# Patient Record
Sex: Male | Born: 1935 | Race: White | Hispanic: No | Smoking: Current every day smoker
Health system: Southern US, Community
[De-identification: ages and names within clinical notes are randomized; demographics above are authoritative.]

## PROBLEM LIST (undated history)

## (undated) DIAGNOSIS — I10 Essential (primary) hypertension: Secondary | ICD-10-CM

## (undated) DIAGNOSIS — K859 Acute pancreatitis without necrosis or infection, unspecified: Secondary | ICD-10-CM

## (undated) DIAGNOSIS — N183 Chronic kidney disease, stage 3 unspecified: Secondary | ICD-10-CM

## (undated) DIAGNOSIS — C44300 Unspecified malignant neoplasm of skin of unspecified part of face: Secondary | ICD-10-CM

## (undated) DIAGNOSIS — I350 Nonrheumatic aortic (valve) stenosis: Secondary | ICD-10-CM

## (undated) DIAGNOSIS — M1990 Unspecified osteoarthritis, unspecified site: Secondary | ICD-10-CM

## (undated) DIAGNOSIS — L409 Psoriasis, unspecified: Secondary | ICD-10-CM

## (undated) HISTORY — PX: EXCISIONAL HEMORRHOIDECTOMY: SHX1541

## (undated) HISTORY — DX: Psoriasis, unspecified: L40.9

## (undated) HISTORY — DX: Nonrheumatic aortic (valve) stenosis: I35.0

## (undated) HISTORY — DX: Unspecified osteoarthritis, unspecified site: M19.90

## (undated) HISTORY — DX: Essential (primary) hypertension: I10

## (undated) HISTORY — PX: CATARACT EXTRACTION: SUR2

---

## 1999-07-26 ENCOUNTER — Encounter: Payer: Self-pay | Admitting: *Deleted

## 1999-07-26 ENCOUNTER — Encounter: Admission: RE | Admit: 1999-07-26 | Discharge: 1999-07-26 | Payer: Self-pay | Admitting: *Deleted

## 2010-06-18 ENCOUNTER — Emergency Department (HOSPITAL_COMMUNITY): Payer: Medicare Other

## 2010-06-18 ENCOUNTER — Emergency Department (HOSPITAL_COMMUNITY)
Admission: EM | Admit: 2010-06-18 | Discharge: 2010-06-19 | Disposition: A | Discharge status: Home / Self Care | Payer: Medicare Other | Attending: Emergency Medicine | Admitting: Emergency Medicine

## 2010-06-18 DIAGNOSIS — F411 Generalized anxiety disorder: Secondary | ICD-10-CM | POA: Insufficient documentation

## 2010-06-18 DIAGNOSIS — I10 Essential (primary) hypertension: Secondary | ICD-10-CM | POA: Insufficient documentation

## 2010-06-18 DIAGNOSIS — N201 Calculus of ureter: Secondary | ICD-10-CM | POA: Insufficient documentation

## 2010-06-18 DIAGNOSIS — R1031 Right lower quadrant pain: Secondary | ICD-10-CM | POA: Insufficient documentation

## 2010-06-18 LAB — CBC
HCT: 42.7 % (ref 39.0–52.0)
Hemoglobin: 14.8 g/dL (ref 13.0–17.0)
MCH: 33.6 pg (ref 26.0–34.0)
MCHC: 34.7 g/dL (ref 30.0–36.0)
MCV: 96.8 fL (ref 78.0–100.0)
Platelets: 121 10*3/uL — ABNORMAL LOW (ref 150–400)
RBC: 4.41 MIL/uL (ref 4.22–5.81)
RDW: 13.7 % (ref 11.5–15.5)
WBC: 11 10*3/uL — ABNORMAL HIGH (ref 4.0–10.5)

## 2010-06-18 LAB — DIFFERENTIAL
Basophils Absolute: 0 10*3/uL (ref 0.0–0.1)
Basophils Relative: 0 % (ref 0–1)
Eosinophils Absolute: 0 10*3/uL (ref 0.0–0.7)
Eosinophils Relative: 0 % (ref 0–5)
Lymphocytes Relative: 14 % (ref 12–46)
Lymphs Abs: 1.6 10*3/uL (ref 0.7–4.0)
Monocytes Absolute: 0.8 10*3/uL (ref 0.1–1.0)
Monocytes Relative: 7 % (ref 3–12)
Neutro Abs: 8.6 10*3/uL — ABNORMAL HIGH (ref 1.7–7.7)
Neutrophils Relative %: 78 % — ABNORMAL HIGH (ref 43–77)

## 2010-06-19 ENCOUNTER — Encounter (HOSPITAL_COMMUNITY): Payer: Self-pay

## 2010-06-19 LAB — URINALYSIS, ROUTINE W REFLEX MICROSCOPIC
Bilirubin Urine: NEGATIVE
Glucose, UA: NEGATIVE mg/dL
Ketones, ur: NEGATIVE mg/dL
Nitrite: NEGATIVE
Protein, ur: NEGATIVE mg/dL
Specific Gravity, Urine: 1.022 (ref 1.005–1.030)
pH: 5.5 (ref 5.0–8.0)

## 2010-06-19 LAB — BASIC METABOLIC PANEL
BUN: 23 mg/dL (ref 6–23)
CO2: 23 mEq/L (ref 19–32)
Calcium: 9.2 mg/dL (ref 8.4–10.5)
Chloride: 104 mEq/L (ref 96–112)
Creatinine, Ser: 1.66 mg/dL — ABNORMAL HIGH (ref 0.4–1.5)
Glucose, Bld: 125 mg/dL — ABNORMAL HIGH (ref 70–99)
Potassium: 4.3 mEq/L (ref 3.5–5.1)
Sodium: 135 mEq/L (ref 135–145)

## 2010-06-20 LAB — URINE CULTURE
Culture  Setup Time: 201203131015
Culture: NO GROWTH

## 2014-01-12 ENCOUNTER — Encounter (HOSPITAL_COMMUNITY): Payer: Self-pay | Admitting: Emergency Medicine

## 2014-01-12 ENCOUNTER — Emergency Department (HOSPITAL_COMMUNITY): Payer: Medicare Other

## 2014-01-12 ENCOUNTER — Inpatient Hospital Stay (HOSPITAL_COMMUNITY)
Admission: EM | Admit: 2014-01-12 | Discharge: 2014-02-06 | DRG: 871 | Disposition: E | Payer: Medicare Other | Attending: Internal Medicine | Admitting: Internal Medicine

## 2014-01-12 DIAGNOSIS — K859 Acute pancreatitis without necrosis or infection, unspecified: Secondary | ICD-10-CM | POA: Diagnosis present

## 2014-01-12 DIAGNOSIS — J90 Pleural effusion, not elsewhere classified: Secondary | ICD-10-CM

## 2014-01-12 DIAGNOSIS — D696 Thrombocytopenia, unspecified: Secondary | ICD-10-CM | POA: Diagnosis present

## 2014-01-12 DIAGNOSIS — E875 Hyperkalemia: Secondary | ICD-10-CM | POA: Diagnosis present

## 2014-01-12 DIAGNOSIS — F17210 Nicotine dependence, cigarettes, uncomplicated: Secondary | ICD-10-CM | POA: Diagnosis present

## 2014-01-12 DIAGNOSIS — K7200 Acute and subacute hepatic failure without coma: Secondary | ICD-10-CM | POA: Diagnosis present

## 2014-01-12 DIAGNOSIS — J9601 Acute respiratory failure with hypoxia: Secondary | ICD-10-CM | POA: Diagnosis present

## 2014-01-12 DIAGNOSIS — D72829 Elevated white blood cell count, unspecified: Secondary | ICD-10-CM | POA: Diagnosis present

## 2014-01-12 DIAGNOSIS — M1990 Unspecified osteoarthritis, unspecified site: Secondary | ICD-10-CM | POA: Diagnosis present

## 2014-01-12 DIAGNOSIS — N202 Calculus of kidney with calculus of ureter: Secondary | ICD-10-CM | POA: Diagnosis present

## 2014-01-12 DIAGNOSIS — N183 Chronic kidney disease, stage 3 unspecified: Secondary | ICD-10-CM | POA: Diagnosis present

## 2014-01-12 DIAGNOSIS — I350 Nonrheumatic aortic (valve) stenosis: Secondary | ICD-10-CM | POA: Diagnosis present

## 2014-01-12 DIAGNOSIS — K567 Ileus, unspecified: Secondary | ICD-10-CM | POA: Diagnosis present

## 2014-01-12 DIAGNOSIS — A419 Sepsis, unspecified organism: Principal | ICD-10-CM | POA: Diagnosis present

## 2014-01-12 DIAGNOSIS — R6520 Severe sepsis without septic shock: Secondary | ICD-10-CM | POA: Diagnosis present

## 2014-01-12 DIAGNOSIS — Z85828 Personal history of other malignant neoplasm of skin: Secondary | ICD-10-CM

## 2014-01-12 DIAGNOSIS — I129 Hypertensive chronic kidney disease with stage 1 through stage 4 chronic kidney disease, or unspecified chronic kidney disease: Secondary | ICD-10-CM | POA: Diagnosis present

## 2014-01-12 DIAGNOSIS — E872 Acidosis: Secondary | ICD-10-CM | POA: Diagnosis present

## 2014-01-12 DIAGNOSIS — N179 Acute kidney failure, unspecified: Secondary | ICD-10-CM | POA: Diagnosis present

## 2014-01-12 DIAGNOSIS — K831 Obstruction of bile duct: Secondary | ICD-10-CM

## 2014-01-12 DIAGNOSIS — R17 Unspecified jaundice: Secondary | ICD-10-CM | POA: Diagnosis not present

## 2014-01-12 DIAGNOSIS — N201 Calculus of ureter: Secondary | ICD-10-CM

## 2014-01-12 DIAGNOSIS — Z515 Encounter for palliative care: Secondary | ICD-10-CM | POA: Diagnosis not present

## 2014-01-12 DIAGNOSIS — I10 Essential (primary) hypertension: Secondary | ICD-10-CM | POA: Diagnosis present

## 2014-01-12 DIAGNOSIS — N200 Calculus of kidney: Secondary | ICD-10-CM

## 2014-01-12 DIAGNOSIS — R6510 Systemic inflammatory response syndrome (SIRS) of non-infectious origin without acute organ dysfunction: Secondary | ICD-10-CM

## 2014-01-12 DIAGNOSIS — Z66 Do not resuscitate: Secondary | ICD-10-CM | POA: Diagnosis not present

## 2014-01-12 DIAGNOSIS — R0603 Acute respiratory distress: Secondary | ICD-10-CM

## 2014-01-12 DIAGNOSIS — R198 Other specified symptoms and signs involving the digestive system and abdomen: Secondary | ICD-10-CM

## 2014-01-12 HISTORY — DX: Acute pancreatitis without necrosis or infection, unspecified: K85.90

## 2014-01-12 HISTORY — DX: Chronic kidney disease, stage 3 unspecified: N18.30

## 2014-01-12 HISTORY — DX: Unspecified malignant neoplasm of skin of unspecified part of face: C44.300

## 2014-01-12 HISTORY — DX: Chronic kidney disease, stage 3 (moderate): N18.3

## 2014-01-12 LAB — COMPREHENSIVE METABOLIC PANEL
ALBUMIN: 3.6 g/dL (ref 3.5–5.2)
ALK PHOS: 102 U/L (ref 39–117)
ALT: 28 U/L (ref 0–53)
AST: 62 U/L — ABNORMAL HIGH (ref 0–37)
Anion gap: 13 (ref 5–15)
BILIRUBIN TOTAL: 1.6 mg/dL — AB (ref 0.3–1.2)
BUN: 20 mg/dL (ref 6–23)
CALCIUM: 8.9 mg/dL (ref 8.4–10.5)
CHLORIDE: 106 meq/L (ref 96–112)
CO2: 24 mEq/L (ref 19–32)
CREATININE: 1.22 mg/dL (ref 0.50–1.35)
GFR, EST AFRICAN AMERICAN: 61 mL/min — AB (ref >90)
GFR, EST NON AFRICAN AMERICAN: 52 mL/min — AB (ref >90)
Glucose, Bld: 137 mg/dL — ABNORMAL HIGH (ref 70–99)
Potassium: 4.6 mEq/L (ref 3.7–5.3)
Sodium: 143 mEq/L (ref 137–147)
TOTAL PROTEIN: 7 g/dL (ref 6.0–8.3)

## 2014-01-12 LAB — CBC WITH DIFFERENTIAL/PLATELET
Basophils Absolute: 0 10*3/uL (ref 0.0–0.1)
Basophils Relative: 0 % (ref 0–1)
EOS PCT: 0 % (ref 0–5)
Eosinophils Absolute: 0 10*3/uL (ref 0.0–0.7)
HEMATOCRIT: 42.7 % (ref 39.0–52.0)
Hemoglobin: 14.9 g/dL (ref 13.0–17.0)
LYMPHS ABS: 1.2 10*3/uL (ref 0.7–4.0)
LYMPHS PCT: 8 % — AB (ref 12–46)
MCH: 33 pg (ref 26.0–34.0)
MCHC: 34.9 g/dL (ref 30.0–36.0)
MCV: 94.7 fL (ref 78.0–100.0)
Monocytes Absolute: 0.4 10*3/uL (ref 0.1–1.0)
Monocytes Relative: 3 % (ref 3–12)
NEUTROS ABS: 12.5 10*3/uL — AB (ref 1.7–7.7)
Neutrophils Relative %: 89 % — ABNORMAL HIGH (ref 43–77)
PLATELETS: 103 10*3/uL — AB (ref 150–400)
RBC: 4.51 MIL/uL (ref 4.22–5.81)
RDW: 13.8 % (ref 11.5–15.5)
WBC: 14.1 10*3/uL — AB (ref 4.0–10.5)

## 2014-01-12 LAB — LIPASE, BLOOD: Lipase: >3000 U/L — ABNORMAL HIGH (ref 11–59)

## 2014-01-12 LAB — PROTIME-INR
INR: 1.1 (ref 0.00–1.49)
PROTHROMBIN TIME: 14.2 s (ref 11.6–15.2)

## 2014-01-12 LAB — APTT: APTT: 28 s (ref 24–37)

## 2014-01-12 LAB — I-STAT TROPONIN, ED: TROPONIN I, POC: 0 ng/mL (ref 0.00–0.08)

## 2014-01-12 MED ORDER — HYDROMORPHONE HCL 1 MG/ML IJ SOLN
1.0000 mg | INTRAMUSCULAR | Status: DC | PRN
Start: 2014-01-12 — End: 2014-01-13
  Administered 2014-01-12 – 2014-01-13 (×4): 1 mg via INTRAVENOUS
  Filled 2014-01-12 (×4): qty 1

## 2014-01-12 MED ORDER — SODIUM CHLORIDE 0.9 % IV SOLN
INTRAVENOUS | Status: DC
  Administered 2014-01-12 – 2014-01-13 (×2): via INTRAVENOUS

## 2014-01-12 MED ORDER — ALBUTEROL SULFATE (2.5 MG/3ML) 0.083% IN NEBU: 2.5000 mg | INHALATION_SOLUTION | RESPIRATORY_TRACT | Status: DC | PRN

## 2014-01-12 MED ORDER — IOHEXOL 300 MG/ML  SOLN: 25.0000 mL | Freq: Once | INTRAMUSCULAR | Status: DC | PRN

## 2014-01-12 MED ORDER — NITROGLYCERIN 0.4 MG SL SUBL
0.4000 mg | SUBLINGUAL_TABLET | SUBLINGUAL | Status: DC | PRN
  Administered 2014-01-12: 0.4 mg via SUBLINGUAL
  Filled 2014-01-12: qty 1

## 2014-01-12 MED ORDER — MORPHINE SULFATE 4 MG/ML IJ SOLN
4.0000 mg | Freq: Once | INTRAMUSCULAR | Status: AC
  Administered 2014-01-12: 4 mg via INTRAVENOUS
  Filled 2014-01-12: qty 1

## 2014-01-12 MED ORDER — ONDANSETRON HCL 4 MG PO TABS: 4.0000 mg | ORAL_TABLET | Freq: Four times a day (QID) | ORAL | Status: DC | PRN

## 2014-01-12 MED ORDER — ONDANSETRON HCL 4 MG/2ML IJ SOLN
4.0000 mg | Freq: Once | INTRAMUSCULAR | Status: AC
  Administered 2014-01-12: 4 mg via INTRAVENOUS
  Filled 2014-01-12: qty 2

## 2014-01-12 MED ORDER — HYDROCODONE-ACETAMINOPHEN 5-325 MG PO TABS
1.0000 | ORAL_TABLET | ORAL | Status: DC | PRN
  Administered 2014-01-12 (×2): 2 via ORAL
  Filled 2014-01-12 (×2): qty 2

## 2014-01-12 MED ORDER — ENOXAPARIN SODIUM 40 MG/0.4ML ~~LOC~~ SOLN
40.0000 mg | SUBCUTANEOUS | Status: DC
  Administered 2014-01-12: 40 mg via SUBCUTANEOUS
  Filled 2014-01-12 (×2): qty 0.4

## 2014-01-12 MED ORDER — IOHEXOL 300 MG/ML  SOLN
80.0000 mL | Freq: Once | INTRAMUSCULAR | Status: AC | PRN
  Administered 2014-01-12: 80 mL via INTRAVENOUS

## 2014-01-12 MED ORDER — HYDROMORPHONE HCL 1 MG/ML IJ SOLN
1.0000 mg | Freq: Once | INTRAMUSCULAR | Status: AC
  Administered 2014-01-12: 1 mg via INTRAVENOUS
  Filled 2014-01-12: qty 1

## 2014-01-12 MED ORDER — ONDANSETRON HCL 4 MG/2ML IJ SOLN
4.0000 mg | Freq: Four times a day (QID) | INTRAMUSCULAR | Status: DC | PRN
  Administered 2014-01-12: 4 mg via INTRAVENOUS
  Filled 2014-01-12: qty 2

## 2014-01-12 MED ORDER — LISINOPRIL 20 MG PO TABS
20.0000 mg | ORAL_TABLET | Freq: Every day | ORAL | Status: DC
  Administered 2014-01-13: 20 mg via ORAL
  Filled 2014-01-12: qty 1

## 2014-01-12 MED ORDER — ASPIRIN 325 MG PO TABS
325.0000 mg | ORAL_TABLET | Freq: Every day | ORAL | Status: DC
  Administered 2014-01-13: 325 mg via ORAL
  Filled 2014-01-12: qty 1

## 2014-01-12 MED ORDER — ACETAMINOPHEN 325 MG PO TABS: 650.0000 mg | ORAL_TABLET | Freq: Four times a day (QID) | ORAL | Status: DC | PRN

## 2014-01-12 MED ORDER — ACETAMINOPHEN 650 MG RE SUPP
650.0000 mg | Freq: Four times a day (QID) | RECTAL | Status: DC | PRN
  Administered 2014-01-13: 650 mg via RECTAL
  Filled 2014-01-12: qty 1

## 2014-01-12 NOTE — Progress Notes (Signed)
[name] [id number] Code Status: Full   Admission Data: 01/13/2014 6:00 PM Attending Provider:  Hongalgi  LDJ:[id number], MD Consults/ Treatment Team:    Jesse Morton is a 80 y.o. male patient admitted from ED awake, alert - oriented  X 3 - no acute distress noted.  VSS - Blood pressure 159/83, pulse 92, temperature 98.2 F (36.8 C), temperature source Oral, resp. rate 20, height 5\' 7"  (1.702 m), weight 84.2 kg (185 lb 10 oz), SpO2 94.00%.    IV in place, occlusive dsg intact without redness.  Orientation to room, and floor completed with information packet given to patient/family.  Patient declined safety video at this time.  Admission INP armband ID verified with patient/family, and in place.   SR up x 2, fall assessment complete, with patient and family able to verbalize understanding of risk associated with falls, and verbalized understanding to call nsg before up out of bed.  Call light within reach, patient able to voice, and demonstrate understanding.       Will cont to eval and treat per MD orders.  Henriette Combs, [place] 01/20/2014 6:00 PM

## 2014-01-12 NOTE — ED Notes (Signed)
Per EMS, epigastric pain on arrival. They gave 3 nitro, 324mg  of asa and 4mg  zofran. On arrival, patient reports no chest pain now, but pain across upper back on both sides. Reports he was sitting at pain onset this morning. Reports doing yardwork yesterday that was strenuous for him.

## 2014-01-12 NOTE — ED Notes (Signed)
Notified Dr. Christy Gentles pain has increased in back. Nausea resolved.

## 2014-01-12 NOTE — ED Provider Notes (Signed)
CSN: [id number]     Arrival date & time 02/04/2014  7341 History   First MD Initiated Contact with Patient 02/03/2014 [phone]     Chief Complaint  Patient presents with  . Chest Pain      Patient is a 80 y.o. male presenting with chest pain. The history is provided by the patient.  Chest Pain Pain location:  Substernal area Pain quality: pressure   Pain severity:  Moderate Onset quality:  Sudden Timing:  Constant Progression:  Improving Chronicity:  New Relieved by:  Aspirin and nitroglycerin Worsened by:  Nothing tried Associated symptoms: nausea and shortness of breath   Associated symptoms: no cough, no fever and no syncope   Patient reports he woke up this morning, walked outside to get the paper when he began having chest and epigastric pain.  He reports feeling nausea.  EMS was called and he was given NTG (and pt already took ASA)   He reports CP is improving,but he now has upper back pain He has never had this before He reports strenuous activity yesterday but no pain yesterday   He denies h/o CAD He denies ripping/tearing sensation in his back  Past Medical History  Diagnosis Date  . Arthritis   . Hypertension   . Aortic stenosis   . Psoriasis    History reviewed. No pertinent past surgical history. History reviewed. No pertinent family history. History  Substance Use Topics  . Smoking status: Current Every Day Smoker  . Smokeless tobacco: Not on file  . Alcohol Use: Not on file    Review of Systems  Constitutional: Negative for fever.  Respiratory: Positive for shortness of breath. Negative for cough.   Cardiovascular: Positive for chest pain. Negative for syncope.  Gastrointestinal: Positive for nausea.  Neurological: Negative for syncope.  All other systems reviewed and are negative.     Allergies  Review of patient's allergies indicates no known allergies.  Home Medications   Prior to Admission medications   Medication Sig Start Date End Date  Taking? Authorizing Provider  Acetaminophen (ACETAMINOPHEN 8 HOUR) 650 MG TABS Take by mouth 2 (two) times daily as needed.    Historical Provider, MD  Acetaminophen-Pamabrom (CRAMP TABS) 325-25 MG TABS Take by mouth. For leg cramps    Historical Provider, MD  aspirin 325 MG tablet Take 325 mg by mouth daily.    Historical Provider, MD  Cyanocobalamin (VITAMIN B-12) 1000 MCG SUBL Place under the tongue. lozenge    Historical Provider, MD  docusate sodium (COLACE) 100 MG capsule Take 100 mg by mouth daily as needed for mild constipation.    Historical Provider, MD  lisinopril (PRINIVIL,ZESTRIL) 20 MG tablet Take 20 mg by mouth daily.    Historical Provider, MD   BP 104/51  Pulse 79  Temp(Src) 98.6 F (37 C) (Oral)  Resp 17  Ht 5\' 7"  (1.702 m)  Wt 180 lb (81.647 kg)  BMI 28.19 kg/m2  SpO2 90% Physical Exam CONSTITUTIONAL: elderly, ill appearing, resting with eyes closed HEAD: Normocephalic/atraumatic EYES: EOMI/PERRL ENMT: Mucous membranes moist NECK: supple no meningeal signs SPINE:entire spine nontender CV: K2/I0 noted, systolic ejection murmur noted LUNGS: bilateral crackles, right>left, no apparent distress ABDOMEN: soft, nontender, no rebound or guarding GU:no cva tenderness NEURO: Pt is awake/alert, moves all extremitiesx4 EXTREMITIES: pulses normal/equal x4, full ROM SKIN: warm, color normal PSYCH: no abnormalities of mood noted  ED Course  Procedures  9:45 AM Pt ill appearing.  I am concerned for ACS at this time.  He has been given NTG/ASA.  Labs/imaging pending 10:10 AM Pt reports nausea and upper back pain, no CP Repeat EKG essentially unchanged Labs pending at this time 10:36 AM Initial troponin negative Pt reports upper back pain.  He apppears uncomfortable.  Still have concern for ACS.  Will try dose of NTG 11:28 AM Lipase >3000 Will proceed with CT imaging 2:15 PM Pt awaiting CT imaging Pt reports abd pain 3:47 PM Acute pancreatitis noted He also has  ureteral stone Pt with epigastric abdominal tenderness He admits to daily ETOH use (drinks 1-2 beers daily) Will call [facility] for admit 4:01 PM D/w dr Algis Liming, will admit Less likely ACS at this, suspect ETOH induced pancreatitis   Labs Review Labs Reviewed  CBC WITH DIFFERENTIAL - Abnormal; Notable for the following:    WBC 14.1 (*)    Neutrophils Relative % 89 (*)    Neutro Abs 12.5 (*)    Lymphocytes Relative 8 (*)    All other components within normal limits  COMPREHENSIVE METABOLIC PANEL - Abnormal; Notable for the following:    Glucose, Bld 137 (*)    AST 62 (*)    Total Bilirubin 1.6 (*)    GFR calc non Af Amer 52 (*)    GFR calc Af Amer 61 (*)    All other components within normal limits  LIPASE, BLOOD - Abnormal; Notable for the following:    Lipase >3000 (*)    All other components within normal limits  PROTIME-INR  APTT  I-STAT TROPOININ, ED    Imaging Review Ct Abdomen Pelvis W Contrast  01/27/2014   CLINICAL DATA:  Mid back and abdominal pain beginning yesterday. Chest pain.  EXAM: CT ABDOMEN AND PELVIS WITH CONTRAST  TECHNIQUE: Multidetector CT imaging of the abdomen and pelvis was performed using the standard protocol following bolus administration of intravenous contrast.  CONTRAST:  80 mL OMNIPAQUE IOHEXOL 300 MG/ML  SOLN  COMPARISON:  CT abdomen and pelvis 06/19/2010.  FINDINGS: Calcified pleural plaques are seen on the right. Dependent atelectasis is identified. Heart size is enlarged. Extensive calcific coronary artery disease is identified. There is no pleural or pericardial effusion.  There is a small volume of upper abdominal ascites. Stranding is seen about the pancreas. No organized fluid collection is present. The pancreas enhances homogeneously. The splenic and portal veins are patent.  The gallbladder, liver, spleen, biliary tree and left kidney are unremarkable. There is a staghorn is calculus in the right kidney. A linear calcification at the right UVJ  consistent with a stone measures 0.7 cm in diameter. The prostate gland is unremarkable.  Aortoiliac atherosclerosis without aneurysm is present. There is no lymphadenopathy. Colonic diverticulosis without diverticulitis is identified. The small and appendix appear normal.  Bones demonstrate remote superior endplate compression fractures of L1 and L2. No lytic or sclerotic lesion is identified.  IMPRESSION: Findings consistent with pancreatitis without pancreatic necrosis or pseudocyst formation. Associated upper abdominal ascites is identified.  Staghorn calculus right kidney. A 0.7 cm stone is seen at the right UVJ.  Extensive atherosclerotic vascular disease including calcific coronary atherosclerosis.  Calcified pleural plaques on the right may be due to some prior infectious or inflammatory process or possibly asbestos exposure.  Diverticulosis without diverticulitis.   Electronically Signed   By: Inge Rise M.D.   On: 01/07/2014 15:34   Dg Chest Portable 1 View  01/21/2014   CLINICAL DATA:  Chest discomfort and generalized weakness; history of aortic stenosis, anemia, hypertension, and tobacco use,  initial visit.  EXAM: PORTABLE CHEST - 1 VIEW  COMPARISON:  None.  FINDINGS: The lungs are hypoinflated which limits the study. The interstitial markings are increased bilaterally. There is pleural calcification. There is blunting of the left lateral costophrenic angle. The cardiopericardial silhouette is enlarged. The central pulmonary vascularity is prominent. The bony thorax is unremarkable.  IMPRESSION: There is bilateral pulmonary hypo inflation with coarse interstitial lung markings. This may reflect low-grade CHF or early interstitial pneumonia. There is a small left pleural effusion. A followup PA and lateral chest x-ray with deep inspiration would be useful when the patient can tolerate the procedure.   Electronically Signed   By: David  Martinique   On: 01/25/2014 10:09     EKG  Interpretation   Date/Time:  Wednesday January 12 2014 09:27:01 EDT Ventricular Rate:  86 PR Interval:  198 QRS Duration: 128 QT Interval:  422 QTC Calculation: 505 R Axis:   -74 Text Interpretation:  Sinus rhythm Right bundle branch block Anterolateral  infarct, old Abnormal ekg No previous ECGs available Confirmed by Christy Gentles   MD, [place] ([id number]) on 02/05/2014 9:32:38 AM      EKG Interpretation  Date/Time:  Wednesday January 12 2014 09:52:10 EDT Ventricular Rate:  66 PR Interval:  215 QRS Duration: 142 QT Interval:  483 QTC Calculation: 506 R Axis:   -70 Text Interpretation:  Sinus rhythm Borderline prolonged PR interval RBBB and LAFB No significant change since last tracing Confirmed by [place] ([id number]) on 01/08/2014 9:58:26 AM       MDM   Final diagnoses:  Acute pancreatitis, unspecified pancreatitis type  Right ureteral stone  Staghorn renal calculus  Pleural effusion, left    Nursing notes including past medical history and social history reviewed and considered in documentation xrays reviewed and considered Labs/vital reviewed and considered     Sharyon Cable, MD 01/11/2014 1601

## 2014-01-12 NOTE — ED Notes (Signed)
Patient is unable to void at present. 

## 2014-01-12 NOTE — Progress Notes (Signed)
Report called to ED  

## 2014-01-12 NOTE — ED Notes (Signed)
All belongings, clothes and glasses, sent with patient

## 2014-01-12 NOTE — H&P (Signed)
History and Physical  Jesse Morton JGG:[id number] DOB: 09/10/1926 DOA: 01/09/2014  Referring physician: Dr. Ripley Fraise, EDP PCP: Donnie Coffin, MD  Outpatient Specialists:  1. None  Chief Complaint: Chest/abdominal pain.  HPI: Jesse Morton is a 80 y.o. male with history of hypertension, tobacco abuse, alcohol use, presented to ED with above complaints. Patient lives alone, independent and physically quite active. He was in his usual state of health until sometime this morning. He started experiencing lower mid sternal/epigastric abdominal pain after breakfast this morning. He states that it started subacutely, rated 10/10 in severity, unable to describe type of pain, radiating to back, associated with nausea but no vomiting. Last normal p.m. yesterday. Patient to the fold dose of aspirin. Sublingual NTG provided by EMS did not help his pain. He presented to the ED where lipase >2000 and CT abdomen confirms acute pancreatitis without complicating features. Patient states that his abdominal pain has now reduced to 4-6/10 after management in ED. Hospitalist admission requested. Patient denies prior episodes of pancreatitis or gallstones. Has never had a colonoscopy.   Review of Systems: All systems reviewed and apart from history of presenting illness, are negative.  Past Medical History  Diagnosis Date  . Arthritis   . Hypertension   . Aortic stenosis   . Psoriasis    History reviewed. No pertinent past surgical history. Social History:  reports that he has been smoking.  He does not have any smokeless tobacco history on file. His alcohol and drug histories are not on file. Widowed. Lives alone. Independent of activities of daily living. Apparently smokes a pack of cigarettes over a week. States that he drinks up to 2 beers a couple times per week and the last time was yesterday but denies drinking daily or using hard liquor.  No Known Allergies  History reviewed. No pertinent  family history. negative family history.   Prior to Admission medications   Medication Sig Start Date End Date Taking? Authorizing Provider  Acetaminophen (ACETAMINOPHEN 8 HOUR) 650 MG TABS Take by mouth 2 (two) times daily as needed.   Yes Historical Provider, MD  aspirin 325 MG tablet Take 325 mg by mouth daily.   Yes Historical Provider, MD  Cyanocobalamin (VITAMIN B-12) 1000 MCG SUBL Place under the tongue. lozenge   Yes Historical Provider, MD  docusate sodium (COLACE) 100 MG capsule Take 100 mg by mouth daily as needed for mild constipation.   Yes Historical Provider, MD  lisinopril (PRINIVIL,ZESTRIL) 20 MG tablet Take 20 mg by mouth daily.   Yes Historical Provider, MD  Omega-3 Fatty Acids (FISH OIL PO) Take 1 capsule by mouth 2 (two) times daily.   Yes Historical Provider, MD   Physical Exam: Filed Vitals:   01/08/2014 1400 02/01/2014 1430 01/13/2014 1531 02/01/2014 1700  BP: 138/75 147/64 155/64 159/76  Pulse: 88 83 84 93  Temp:   98.9 F (37.2 C)   TempSrc:   Oral   Resp: 24 23 20 21   Height:      Weight:      SpO2: 96% 95% 99% 94%     General exam: Moderately built and nourished elderly male patient, lying comfortably supine on the gurney in no obvious distress.  Head, eyes and ENT: Nontraumatic and normocephalic. Pupils equally reacting to light and accommodation. Oral mucosa with borderline hydration.  Neck: Supple. No JVD, carotid bruit or thyromegaly.  Lymphatics: No lymphadenopathy.  Respiratory system: Clear to auscultation. No increased work of breathing.  Cardiovascular system:  S1 and S2 heard, RRR. No JVD, murmurs, gallops, clicks or pedal edema.  Gastrointestinal system: Abdomen is nondistended, soft. Mild epigastric tenderness without peritoneal signs. Normal bowel sounds heard. No organomegaly or masses appreciated.  Central nervous system: Alert and oriented. No focal neurological deficits.  Extremities: Symmetric 5 x 5 power. Peripheral pulses symmetrically  felt.   Skin: No rashes or acute findings.  Musculoskeletal system: Negative exam.  Psychiatry: Pleasant and cooperative.   Labs on Admission:  Basic Metabolic Panel:  Recent Labs Lab 01/08/2014 1010  NA 143  K 4.6  CL 106  CO2 24  GLUCOSE 137*  BUN 20  CREATININE 1.22  CALCIUM 8.9   Liver Function Tests:  Recent Labs Lab 02/01/2014 1010  AST 62*  ALT 28  ALKPHOS 102  BILITOT 1.6*  PROT 7.0  ALBUMIN 3.6    Recent Labs Lab 01/13/2014 1010  LIPASE >3000*   No results found for this basename: AMMONIA,  in the last 168 hours CBC:  Recent Labs Lab 01/28/2014 1010  WBC 14.1*  NEUTROABS 12.5*  HGB 14.9  HCT 42.7  MCV 94.7  PLT 103*   Cardiac Enzymes: No results found for this basename: CKTOTAL, CKMB, CKMBINDEX, TROPONINI,  in the last 168 hours  BNP (last 3 results) No results found for this basename: PROBNP,  in the last 8760 hours CBG: No results found for this basename: GLUCAP,  in the last 168 hours  Radiological Exams on Admission: Ct Abdomen Pelvis W Contrast  01/29/2014   CLINICAL DATA:  Mid back and abdominal pain beginning yesterday. Chest pain.  EXAM: CT ABDOMEN AND PELVIS WITH CONTRAST  TECHNIQUE: Multidetector CT imaging of the abdomen and pelvis was performed using the standard protocol following bolus administration of intravenous contrast.  CONTRAST:  80 mL OMNIPAQUE IOHEXOL 300 MG/ML  SOLN  COMPARISON:  CT abdomen and pelvis 06/19/2010.  FINDINGS: Calcified pleural plaques are seen on the right. Dependent atelectasis is identified. Heart size is enlarged. Extensive calcific coronary artery disease is identified. There is no pleural or pericardial effusion.  There is a small volume of upper abdominal ascites. Stranding is seen about the pancreas. No organized fluid collection is present. The pancreas enhances homogeneously. The splenic and portal veins are patent.  The gallbladder, liver, spleen, biliary tree and left kidney are unremarkable. There is  a staghorn is calculus in the right kidney. A linear calcification at the right UVJ consistent with a stone measures 0.7 cm in diameter. The prostate gland is unremarkable.  Aortoiliac atherosclerosis without aneurysm is present. There is no lymphadenopathy. Colonic diverticulosis without diverticulitis is identified. The small and appendix appear normal.  Bones demonstrate remote superior endplate compression fractures of L1 and L2. No lytic or sclerotic lesion is identified.  IMPRESSION: Findings consistent with pancreatitis without pancreatic necrosis or pseudocyst formation. Associated upper abdominal ascites is identified.  Staghorn calculus right kidney. A 0.7 cm stone is seen at the right UVJ.  Extensive atherosclerotic vascular disease including calcific coronary atherosclerosis.  Calcified pleural plaques on the right may be due to some prior infectious or inflammatory process or possibly asbestos exposure.  Diverticulosis without diverticulitis.   Electronically Signed   By: Inge Rise M.D.   On: 01/09/2014 15:34   Dg Chest Portable 1 View  01/06/2014   CLINICAL DATA:  Chest discomfort and generalized weakness; history of aortic stenosis, anemia, hypertension, and tobacco use, initial visit.  EXAM: PORTABLE CHEST - 1 VIEW  COMPARISON:  None.  FINDINGS: The  lungs are hypoinflated which limits the study. The interstitial markings are increased bilaterally. There is pleural calcification. There is blunting of the left lateral costophrenic angle. The cardiopericardial silhouette is enlarged. The central pulmonary vascularity is prominent. The bony thorax is unremarkable.  IMPRESSION: There is bilateral pulmonary hypo inflation with coarse interstitial lung markings. This may reflect low-grade CHF or early interstitial pneumonia. There is a small left pleural effusion. A followup PA and lateral chest x-ray with deep inspiration would be useful when the patient can tolerate the procedure.    Electronically Signed   By: David  Martinique   On: 01/27/2014 10:09    EKG: Independently reviewed. Sinus rhythm, RBBB, LAFB & no acute findings. No prior EKG's to compare.  Assessment/Plan Principal Problem:   Acute pancreatitis Active Problems:   Thrombocytopenia   Hypertension   Leukocytosis   Stage III chronic kidney disease   1. Acute pancreatitis: Patient's epigastric/lower substernal pain likely secondary to this. Confirmed by CT abdomen and lipase >3000. Etiology unclear.? Secondary to alcohol intake-not heavy per patient and family at bedside. LFTs not suggestive of biliary pancreatitis. No new medications started recently. Treat supportively with bowel rest, pain management and IV fluids. Follow lipase in a.m. May need to consider endoscopy to ultrasound as outpatient to evaluate for etiology. 2. Thrombocytopenia, chronic: Seems stable. Follow CBCs. 3. Leukocytosis: Likely a stress response. Follow CBC in a.m. No clinical focus of infection. 4. Hypertension: Mildly uncontrolled, likely secondary to pain. Continue lisinopril. 5. Stage III chronic kidney disease: Seems stable. Follow BMP.  6. Renal calculi: Seen on CT.      Code Status:  Full  Family Communication:  discussed with patient's niece and her husband at bedside.  Disposition Plan: Home when medically stable.   Time spent: 60 minutes.  Vernell Leep, MD, FACP, FHM. [facility] Pager [phone]  If 7PM-7AM, please contact night-coverage [email or web address] Password [email or web address] 01/19/2014, 5:14 PM

## 2014-01-12 NOTE — ED Notes (Signed)
Denies wanting pain medication at this time.

## 2014-01-12 NOTE — ED Notes (Signed)
Patient transported to CT 

## 2014-01-12 NOTE — ED Notes (Signed)
Called CT about delay. They report 1 ahead of him. Updated patient and family.

## 2014-01-12 NOTE — ED Notes (Signed)
Unable to tolerate PO contrast. OK without it per Dr. Christy Gentles. Notified CT.

## 2014-01-13 ENCOUNTER — Inpatient Hospital Stay (HOSPITAL_COMMUNITY): Payer: Medicare Other

## 2014-01-13 DIAGNOSIS — A419 Sepsis, unspecified organism: Principal | ICD-10-CM

## 2014-01-13 DIAGNOSIS — D72829 Elevated white blood cell count, unspecified: Secondary | ICD-10-CM

## 2014-01-13 DIAGNOSIS — E875 Hyperkalemia: Secondary | ICD-10-CM

## 2014-01-13 DIAGNOSIS — N200 Calculus of kidney: Secondary | ICD-10-CM

## 2014-01-13 DIAGNOSIS — N179 Acute kidney failure, unspecified: Secondary | ICD-10-CM

## 2014-01-13 DIAGNOSIS — R6510 Systemic inflammatory response syndrome (SIRS) of non-infectious origin without acute organ dysfunction: Secondary | ICD-10-CM | POA: Diagnosis present

## 2014-01-13 DIAGNOSIS — K859 Acute pancreatitis, unspecified: Secondary | ICD-10-CM

## 2014-01-13 LAB — URINALYSIS, ROUTINE W REFLEX MICROSCOPIC
Glucose, UA: NEGATIVE mg/dL
Ketones, ur: 15 mg/dL — AB
Nitrite: POSITIVE — AB
Protein, ur: 100 mg/dL — AB
Specific Gravity, Urine: 1.02 (ref 1.005–1.030)
Urobilinogen, UA: 1 mg/dL (ref 0.0–1.0)
pH: 5 (ref 5.0–8.0)

## 2014-01-13 LAB — COMPREHENSIVE METABOLIC PANEL
ALT: 101 U/L — AB (ref 0–53)
ANION GAP: 16 — AB (ref 5–15)
AST: 128 U/L — ABNORMAL HIGH (ref 0–37)
Albumin: 3.4 g/dL — ABNORMAL LOW (ref 3.5–5.2)
Alkaline Phosphatase: 94 U/L (ref 39–117)
BUN: 30 mg/dL — ABNORMAL HIGH (ref 6–23)
CALCIUM: 8.3 mg/dL — AB (ref 8.4–10.5)
CO2: 17 mEq/L — ABNORMAL LOW (ref 19–32)
CREATININE: 1.65 mg/dL — AB (ref 0.50–1.35)
Chloride: 106 mEq/L (ref 96–112)
GFR, EST AFRICAN AMERICAN: 42 mL/min — AB (ref >90)
GFR, EST NON AFRICAN AMERICAN: 36 mL/min — AB (ref >90)
GLUCOSE: 114 mg/dL — AB (ref 70–99)
Potassium: 5.4 mEq/L — ABNORMAL HIGH (ref 3.7–5.3)
Sodium: 139 mEq/L (ref 137–147)
TOTAL PROTEIN: 6.7 g/dL (ref 6.0–8.3)
Total Bilirubin: 7.3 mg/dL — ABNORMAL HIGH (ref 0.3–1.2)

## 2014-01-13 LAB — CBC
HCT: 46.7 % (ref 39.0–52.0)
HEMOGLOBIN: 16.7 g/dL (ref 13.0–17.0)
MCH: 33.7 pg (ref 26.0–34.0)
MCHC: 35.8 g/dL (ref 30.0–36.0)
MCV: 94.3 fL (ref 78.0–100.0)
PLATELETS: 114 10*3/uL — AB (ref 150–400)
RBC: 4.95 MIL/uL (ref 4.22–5.81)
RDW: 14.1 % (ref 11.5–15.5)
WBC: 17.4 10*3/uL — AB (ref 4.0–10.5)

## 2014-01-13 LAB — HEPATIC FUNCTION PANEL
ALT: 96 U/L — ABNORMAL HIGH (ref 0–53)
AST: 111 U/L — AB (ref 0–37)
Albumin: 3.3 g/dL — ABNORMAL LOW (ref 3.5–5.2)
Alkaline Phosphatase: 91 U/L (ref 39–117)
BILIRUBIN DIRECT: 7.5 mg/dL — AB (ref 0.0–0.3)
BILIRUBIN TOTAL: 8.4 mg/dL — AB (ref 0.3–1.2)
Indirect Bilirubin: 0.9 mg/dL (ref 0.3–0.9)
Total Protein: 6.7 g/dL (ref 6.0–8.3)

## 2014-01-13 LAB — URINE MICROSCOPIC-ADD ON

## 2014-01-13 LAB — POCT I-STAT 3, ART BLOOD GAS (G3+)
Acid-base deficit: 17 mmol/L — ABNORMAL HIGH (ref 0.0–2.0)
BICARBONATE: 13.9 meq/L — AB (ref 20.0–24.0)
O2 Saturation: 90 %
PH ART: 7 — AB (ref 7.350–7.450)
TCO2: 16 mmol/L (ref 0–100)
pCO2 arterial: 57.7 mmHg (ref 35.0–45.0)
pO2, Arterial: 93 mmHg (ref 80.0–100.0)

## 2014-01-13 LAB — BASIC METABOLIC PANEL
Anion gap: 18 — ABNORMAL HIGH (ref 5–15)
BUN: 37 mg/dL — AB (ref 6–23)
CHLORIDE: 106 meq/L (ref 96–112)
CO2: 16 mEq/L — ABNORMAL LOW (ref 19–32)
Calcium: 8.3 mg/dL — ABNORMAL LOW (ref 8.4–10.5)
Creatinine, Ser: 2.12 mg/dL — ABNORMAL HIGH (ref 0.50–1.35)
GFR calc Af Amer: 31 mL/min — ABNORMAL LOW (ref >90)
GFR, EST NON AFRICAN AMERICAN: 27 mL/min — AB (ref >90)
Glucose, Bld: 109 mg/dL — ABNORMAL HIGH (ref 70–99)
Potassium: 5.7 mEq/L — ABNORMAL HIGH (ref 3.7–5.3)
Sodium: 140 mEq/L (ref 137–147)

## 2014-01-13 LAB — LIPASE, BLOOD: Lipase: 2136 U/L — ABNORMAL HIGH (ref 11–59)

## 2014-01-13 MED ORDER — HYDROMORPHONE HCL 1 MG/ML IJ SOLN
0.5000 mg | Freq: Once | INTRAMUSCULAR | Status: AC
  Administered 2014-01-13: 0.5 mg via INTRAVENOUS
  Filled 2014-01-13: qty 1

## 2014-01-13 MED ORDER — DEXTROSE 50 % IV SOLN
INTRAVENOUS | Status: AC
  Administered 2014-01-13: 50 mL via INTRAVENOUS
  Filled 2014-01-13: qty 50

## 2014-01-13 MED ORDER — SODIUM CHLORIDE 0.9 % IV SOLN
250.0000 mg | Freq: Two times a day (BID) | INTRAVENOUS | Status: DC
  Filled 2014-01-13: qty 250

## 2014-01-13 MED ORDER — SCOPOLAMINE 1 MG/3DAYS TD PT72
1.0000 | MEDICATED_PATCH | TRANSDERMAL | Status: DC
  Filled 2014-01-13: qty 1

## 2014-01-13 MED ORDER — ENOXAPARIN SODIUM 30 MG/0.3ML ~~LOC~~ SOLN
30.0000 mg | SUBCUTANEOUS | Status: DC
  Administered 2014-01-13: 30 mg via SUBCUTANEOUS
  Filled 2014-01-13: qty 0.3

## 2014-01-13 MED ORDER — LORAZEPAM 2 MG/ML IJ SOLN: 1.0000 mg | Freq: Once | INTRAMUSCULAR | Status: DC

## 2014-01-13 MED ORDER — HYDROMORPHONE HCL 1 MG/ML IJ SOLN
INTRAMUSCULAR | Status: AC
  Filled 2014-01-13: qty 1

## 2014-01-13 MED ORDER — INSULIN ASPART 100 UNIT/ML ~~LOC~~ SOLN
6.0000 [IU] | Freq: Once | SUBCUTANEOUS | Status: AC
  Administered 2014-01-13: 6 [IU] via INTRAVENOUS
  Filled 2014-01-13: qty 0.06

## 2014-01-13 MED ORDER — SODIUM CHLORIDE 0.9 % IV SOLN
1.0000 mg/h | INTRAVENOUS | Status: DC
  Administered 2014-01-13: 1 mg/h via INTRAVENOUS
  Filled 2014-01-13: qty 10

## 2014-01-13 MED ORDER — SODIUM CHLORIDE 0.9 % IV SOLN
INTRAVENOUS | Status: DC
  Administered 2014-01-13: 19:00:00 via INTRAVENOUS

## 2014-01-13 MED ORDER — SODIUM POLYSTYRENE SULFONATE 15 GM/60ML PO SUSP
30.0000 g | Freq: Once | ORAL | Status: AC
  Administered 2014-01-13: 30 g via ORAL
  Filled 2014-01-13: qty 120

## 2014-01-13 MED ORDER — DEXTROSE 50 % IV SOLN
1.0000 | Freq: Once | INTRAVENOUS | Status: AC
  Administered 2014-01-13: 50 mL via INTRAVENOUS

## 2014-01-13 MED ORDER — HYDROMORPHONE HCL 1 MG/ML IJ SOLN
1.0000 mg | INTRAMUSCULAR | Status: DC | PRN
Start: 2014-01-13 — End: 2014-01-13
  Administered 2014-01-13: 1 mg via INTRAVENOUS

## 2014-01-13 MED ORDER — LORAZEPAM 2 MG/ML IJ SOLN: 1.0000 mg | Freq: Once | INTRAMUSCULAR | Status: AC | PRN

## 2014-01-13 MED ORDER — SODIUM CHLORIDE 0.9 % IV BOLUS (SEPSIS)
500.0000 mL | Freq: Once | INTRAVENOUS | Status: AC
  Administered 2014-01-13: 500 mL via INTRAVENOUS

## 2014-01-13 MED ORDER — DORIPENEM 500 MG IV SOLR
250.0000 mg | Freq: Three times a day (TID) | INTRAVENOUS | Status: DC
  Administered 2014-01-13: 250 mg via INTRAVENOUS
  Filled 2014-01-13 (×2): qty 250

## 2014-01-13 NOTE — Consult Note (Signed)
[facility] Consultation Note  Referring Provider: Dr. Oren Binet [facility]) Primary Care Physician:  Donnie Coffin, MD  Reason for Consultation:  pancreatitis  HPI: Jesse Morton is a 80 y.o. male admitted with epigastric pain, nausea, vomiting.  Patient confused and unable to provide much history other than saying he is in pain.  Lipase still over 2000.  Is having worsening pain.  Bilirubin elevated but normal ALP.  Unclear alcohol volume or duration.  Unclear if prior pancreatitis.  U/S and CT show CBD about 61mm with possible gallbladder sludge but no obvious choledocholithiasis, and pancreatitis confirmed on CT.   Past Medical History  Diagnosis Date  . Hypertension   . Aortic stenosis   . Psoriasis   . Chronic kidney disease (CKD), stage III (moderate)     /H&P 01/11/2014  . Acute pancreatitis 01/16/2014    /H&P 01/27/2014  . Arthritis     "'bout all my joints"  . Skin cancer of face     "burned them off"    Past Surgical History  Procedure Laterality Date  . Excisional hemorrhoidectomy    . Cataract extraction Right     Prior to Admission medications   Medication Sig Start Date End Date Taking? Authorizing Provider  Acetaminophen (ACETAMINOPHEN 8 HOUR) 650 MG TABS Take by mouth 2 (two) times daily as needed.   Yes Historical Provider, MD  aspirin 325 MG tablet Take 325 mg by mouth daily.   Yes Historical Provider, MD  Cyanocobalamin (VITAMIN B-12) 1000 MCG SUBL Place under the tongue. lozenge   Yes Historical Provider, MD  docusate sodium (COLACE) 100 MG capsule Take 100 mg by mouth daily as needed for mild constipation.   Yes Historical Provider, MD  lisinopril (PRINIVIL,ZESTRIL) 20 MG tablet Take 20 mg by mouth daily.   Yes Historical Provider, MD  Omega-3 Fatty Acids (FISH OIL PO) Take 1 capsule by mouth 2 (two) times daily.   Yes Historical Provider, MD    Current Facility-Administered Medications  Medication Dose Route Frequency Provider Last Rate Last  Dose  . 0.9 %  sodium chloride infusion   Intravenous Continuous Jonetta Osgood, MD 150 mL/hr at 01/13/14 1051    . acetaminophen (TYLENOL) tablet 650 mg  650 mg Oral Q6H PRN Modena Jansky, MD       Or  . acetaminophen (TYLENOL) suppository 650 mg  650 mg Rectal Q6H PRN Modena Jansky, MD      . albuterol (PROVENTIL) (2.5 MG/3ML) 0.083% nebulizer solution 2.5 mg  2.5 mg Nebulization Q2H PRN Modena Jansky, MD      . dextrose 50 % solution 50 mL  1 ampule Intravenous Once Shanker Kristeen Mans, MD      . dextrose 50 % solution           . enoxaparin (LOVENOX) injection 30 mg  30 mg Subcutaneous Q24H Shanker Kristeen Mans, MD      . HYDROcodone-acetaminophen (NORCO/VICODIN) 5-325 MG per tablet 1-2 tablet  1-2 tablet Oral Q4H PRN Modena Jansky, MD   2 tablet at 01/23/2014 2348  . HYDROmorphone (DILAUDID) injection 1 mg  1 mg Intravenous Q4H PRN Modena Jansky, MD   1 mg at 01/13/14 0940  . insulin aspart (novoLOG) injection 6 Units  6 Units Intravenous Once Shanker Kristeen Mans, MD      . LORazepam (ATIVAN) injection 1 mg  1 mg Intravenous Once PRN Jeryl Columbia, NP      . ondansetron [facility]) tablet 4  mg  4 mg Oral Q6H PRN Modena Jansky, MD       Or  . ondansetron [facility]) injection 4 mg  4 mg Intravenous Q6H PRN Modena Jansky, MD   4 mg at 01/09/2014 2002  . sodium chloride 0.9 % bolus 500 mL  500 mL Intravenous Once Jonetta Osgood, MD   500 mL at 01/13/14 1318    Allergies as of 01/13/2014  . (No Known Allergies)    History reviewed. No pertinent family history.  History   Social History  . Marital Status: Widowed    Spouse Name: N/A    Number of Children: N/A  . Years of Education: N/A   Occupational History  . Not on file.   Social History Main Topics  . Smoking status: Current Every Day Smoker -- 0.25 packs/day for 60 years    Types: Cigarettes  . Smokeless tobacco: Former Systems developer    Types: Chew     Comment: "chewed tobacco alot when I was working; stopped in  the 1990's"  . Alcohol Use: 3.6 oz/week    6 Cans of beer per week  . Drug Use: No  . Sexual Activity: No   Other Topics Concern  . Not on file   Social History Narrative  . No narrative on file    Review of Systems: Unable to obtain due to patient's altered mental status  Physical Exam: Vital signs in last 24 hours: Temp:  [98.2 F (36.8 C)-99.1 F (37.3 C)] 99.1 F (37.3 C) (10/08 0900) Pulse Rate:  [83-109] 100 (10/08 0900) Resp:  [20-24] 24 (10/08 0900) BP: (108-159)/(57-83) 125/57 mmHg (10/08 0900) SpO2:  [93 %-99 %] 98 % (10/08 0900) Weight:  [84.2 kg (185 lb 10 oz)] 84.2 kg (185 lb 10 oz) (10/08 0900) Last BM Date: 01/11/14 General:   Awake but confused, and a bit agitated Head:  Normocephalic and atraumatic. Eyes:  Sclera clear, sclera icteric bilaterally  Conjunctiva pink. Ears:  Normal auditory acuity. Nose:  No deformity, discharge,  or lesions. Mouth:  No deformity or lesions.  Oropharynx pink & moist. Neck:  Supple; no masses or thyromegaly. Lungs:  Diffusely diminished bowel sounds, otherwise Clear throughout to auscultation.  Exam limited as patient could not sit up in bed for me to listen posteriorally. Heart:  Regular rate and rhythm; no murmurs, clicks, rubs,  or gallops. Abdomen:  Moderate distention with tympany, voluntary guarding also present; high-pitched bowel sounds noted; No masses, hepatosplenomegaly or hernias noted.      Msk:  Symmetrical without gross deformities. Normal posture. Pulses:  Normal pulses noted. Extremities:  Without clubbing or edema. Neurologic:  Awake but confused; diffusely weak, unable to sit up in bed without significant assistance. Skin:  Intact without significant lesions or rashes. Psych: Awake but confused, volatile mood and affect.   Lab Results:  Recent Labs  01/28/2014 1010 01/13/14 0459  WBC 14.1* 17.4*  HGB 14.9 16.7  HCT 42.7 46.7  PLT 103* 114*   BMET  Recent Labs  01/16/2014 1010 01/13/14 0459  01/13/14 1055  NA 143 139 140  K 4.6 5.4* 5.7*  CL 106 106 106  CO2 24 17* 16*  GLUCOSE 137* 114* 109*  BUN 20 30* 37*  CREATININE 1.22 1.65* 2.12*  CALCIUM 8.9 8.3* 8.3*   LFT  Recent Labs  01/13/14 1055  PROT 6.7  ALBUMIN 3.3*  AST 111*  ALT 96*  ALKPHOS 91  BILITOT 8.4*  BILIDIR 7.5*  IBILI 0.9  PT/INR  Recent Labs  01/06/2014 1010  LABPROT 14.2  INR 1.10    Studies/Results: Ct Abdomen Pelvis W Contrast  01/24/2014   CLINICAL DATA:  Mid back and abdominal pain beginning yesterday. Chest pain.  EXAM: CT ABDOMEN AND PELVIS WITH CONTRAST  TECHNIQUE: Multidetector CT imaging of the abdomen and pelvis was performed using the standard protocol following bolus administration of intravenous contrast.  CONTRAST:  80 mL OMNIPAQUE IOHEXOL 300 MG/ML  SOLN  COMPARISON:  CT abdomen and pelvis 06/19/2010.  FINDINGS: Calcified pleural plaques are seen on the right. Dependent atelectasis is identified. Heart size is enlarged. Extensive calcific coronary artery disease is identified. There is no pleural or pericardial effusion.  There is a small volume of upper abdominal ascites. Stranding is seen about the pancreas. No organized fluid collection is present. The pancreas enhances homogeneously. The splenic and portal veins are patent.  The gallbladder, liver, spleen, biliary tree and left kidney are unremarkable. There is a staghorn is calculus in the right kidney. A linear calcification at the right UVJ consistent with a stone measures 0.7 cm in diameter. The prostate gland is unremarkable.  Aortoiliac atherosclerosis without aneurysm is present. There is no lymphadenopathy. Colonic diverticulosis without diverticulitis is identified. The small and appendix appear normal.  Bones demonstrate remote superior endplate compression fractures of L1 and L2. No lytic or sclerotic lesion is identified.  IMPRESSION: Findings consistent with pancreatitis without pancreatic necrosis or pseudocyst formation.  Associated upper abdominal ascites is identified.  Staghorn calculus right kidney. A 0.7 cm stone is seen at the right UVJ.  Extensive atherosclerotic vascular disease including calcific coronary atherosclerosis.  Calcified pleural plaques on the right may be due to some prior infectious or inflammatory process or possibly asbestos exposure.  Diverticulosis without diverticulitis.   Electronically Signed   By: Inge Rise M.D.   On: 01/06/2014 15:34   Dg Chest Port 1 View  01/13/2014   CLINICAL DATA:  Respiratory distress  EXAM: PORTABLE CHEST - 1 VIEW  COMPARISON:  January 12, 2014  FINDINGS: The degree of inspiration is shallow. There is mild bibasilar atelectatic change. There is no edema or consolidation. The heart is upper normal in size with pulmonary vascularity within normal limits.  There are multiple pleural calcifications bilaterally. The appearance suggests chronic asbestos exposure. There is no appreciable adenopathy. No focal bone lesions are identified.  IMPRESSION: Mild bibasilar atelectasis. No edema or consolidation. Multiple pleural calcifications, most likely indicative of prior asbestos exposure.   Electronically Signed   By: Lowella Grip M.D.   On: 01/13/2014 13:37   Dg Chest Portable 1 View  01/11/2014   CLINICAL DATA:  Chest discomfort and generalized weakness; history of aortic stenosis, anemia, hypertension, and tobacco use, initial visit.  EXAM: PORTABLE CHEST - 1 VIEW  COMPARISON:  None.  FINDINGS: The lungs are hypoinflated which limits the study. The interstitial markings are increased bilaterally. There is pleural calcification. There is blunting of the left lateral costophrenic angle. The cardiopericardial silhouette is enlarged. The central pulmonary vascularity is prominent. The bony thorax is unremarkable.  IMPRESSION: There is bilateral pulmonary hypo inflation with coarse interstitial lung markings. This may reflect low-grade CHF or early interstitial pneumonia.  There is a small left pleural effusion. A followup PA and lateral chest x-ray with deep inspiration would be useful when the patient can tolerate the procedure.   Electronically Signed   By: David  Martinique   On: 01/19/2014 10:09   US Abdomen Limited Ruq  01/13/2014   CLINICAL DATA:  Pancreatitis  EXAM: US ABDOMEN LIMITED - RIGHT UPPER QUADRANT  COMPARISON:  None.  FINDINGS: Gallbladder:  No shadowing gallstones are noted within gallbladder. Small amount of gallbladder sludge no thickening of gallbladder wall. No sonographic Murphy's sign.  Common bile duct:  Diameter: 8 mm in diameter.  Liver:  No focal lesion identified. Within normal limits in parenchymal echogenicity.  IMPRESSION: 1. No shadowing gallstones are noted within gallbladder. Small amount of gallbladder sludge. 2. No focal hepatic mass.   Electronically Signed   By: Lahoma Crocker M.D.   On: 01/13/2014 11:37   Impression:  1.  Pancreatitis.  Unclear etiology.  Clinically worsening, based on clinical appearance, altered mental status, worsening renal function.  Certainly gallstones are a consideration, but not obviously seen on patient's ultrasound or CT scan.  Possible alcohol but I'm unable to get a good enough history from him to ascertain the etiology. 2.  Elevated LFTs.  Normal ALP but elevated bilirubin.  Shock liver (especially from SIRS-like syndrome which can result from pancreatitis), medication effect are considerations.  Biliary obstruction from either edema from his pancreatitis or from mass or stone are also considerations, but it would be unusual not to have elevation of his alkaline phosphatase or higher degree of biliary ductal dilatation. 3.  Abdominal distention with tympany seen on exam.  Suspect pancreatitis-related ileus.  Plan:  1.  Agree with MRI/MRCP.  I don't see how ERCP would be of benefit at the present time, and, in fact, could actually be deleterious. 2.  Start antibiotics, meropenem, pharmacy to dose. 3.   Continue NPO status, aggressive intravenous fluids.  Might need TPN in near future; doubt he would tolerate nasojejunal tube feeds. 4.  If continues to deteriorate, might need transfer to intermediate care. 5.  [facility] will follow.   LOS: 1 day   Welford Christmas M  01/13/2014, 1:54 PM

## 2014-01-13 NOTE — Progress Notes (Addendum)
Shift event: At beginning of shift, attending called this NP stating pt had declined rapidly and asked this NP to follow up labs and monitor pt. Meanwhile, RN paged NP secondary to pt having change in status. 1. Fever of 101,    2. Lethargic, almost unresponsive,  3. Tachypnea and hypoxia placed on NRB,  4. No bowel sounds, tympanic abd,  5. Decreased urine output,  6. Soft BP.  Pt here for pancreatitis, kidney failure  NP to bedside. S: can not do ROS secondary to pt being unresponsive. Hx per RN as above.  O: VS reviewed, tachycardic, tachypneic, satting normally on NRB, BP in the 80s. Increased WOB noted.  Has eyes open but doesn't respond to tactile or verbal stimulation. Sinus tach. Tachycardic in the 120-130s.  Lungs: diminished air exchange to all lobes. Wet throughout. Crackles. Abd: no BS. Tympany noted. No response to palpation.  A/P: 1. Pancreatitis-reviewed chart, labs. Lipase has trended down but bili rising. No real improvement. See #4.         2. Respiratory failure, likely from pulmonary edema-can not give Lasix secondary to low BP. Hold IVF. ABG                         acidotic, low PO2, high PCO2. See note below.         3. Tachycardic/fever-likely septic. Had ordered lactate but cancelled-see note below.          4. Renal failure-no IVFs secondary to pulmonary edema.           5. Tympanic abdomen-abd film earlier today showed ileus with possible obstruction. Holding NGT placement. See              note about conversation with niece below.          6. Hyperkalemia-treated earlier in day. R/p BMP in progress, but no need to f/up now. NOTE: NP called and spoke with niece, who per her, is his only relative. Discussed gravity of situation and rapid decline of patient over last few hours. After long discussion about issues above, niece stated pt would not want to be "kept alive" by "machines" or intubated. Treatment options offered-comfort care, limited care, full care...neice opts for  comfort care at this point, stating she didn't want him to suffer. I reassured her that this is a prudent choice considering pt's decline and multiple medical issues now of which, include, likely sepsis, organ failure, and respiratory failure.  Pt placed on MSO4 drip, Ativan, Scopalamine. Tylenol for fever. Foley for comfort. O2. No further labs, tests, other treatment of vitals. Expect imminent death. Niece will come to hospital.  Clance Boll, NP [facility]        Pt expired at [place] pronounced by 2 RNs. Death certificate completed. Family at bedside. Offered support and condolences. Family at peace. Thanked [place] for our care.  KJKG, NP

## 2014-01-13 NOTE — Progress Notes (Signed)
Patient received to room 2H19 from 5W.  Oriented to surroundings and introduced to staff.  Pt presents self as jaundiced, in acute distress and guarding abdomen, which is tight and absent of bowel sounds.  Affect lethargic.  Able to answer questions with one word responses due to tachypnea.  Oxygen levels in low 80's, necessitating the use of NRB mask.  It was reported to this nurse that patient's son was notified of his change in level of care.

## 2014-01-13 NOTE — Progress Notes (Signed)
PROGRESS NOTE  Jesse Morton EXB:[id number] DOB: 25-Jan-1927 DOA: 02/02/2014 PCP: Donnie Coffin, MD  HPI/Subjective: A 80 yo male with a prior history of hypertension, tobacco and alcohol use, and stage III CKD presented to the ED with complaints of epigastric pain.  He was admitted for acute pancreatitis.  On the morning of 10/8, the patient states he is in great pain in his epigastric area and right groin.  He has felt this pain all night.  He feel nausea, but has not vomited this morning.    Assessment/Plan: Acute pancreatitis - Confirmed in ED by CT abdomen and lipase >3000.Suspect Biliary etiology-although has hx of ETOH use - Likely biliary obstruction-given significantly elevated Direct Bilirubin. Abd Ultrasound shows no gall stones or CBD Dilatation. Will go ahead and schedule MRCP, have consulted [facility].Continue with supportive care. Unfortunately seems to have deteriorated with worsening leukocytosis, renal and liver function-may need to repeat CT Abd at some point to make sure patient has not developed pancreatic necroses.  Acute on Chronic kidney disease Stage 3 -ARF secondary to acute pancreatitis, ACEI, contrast received with CT Scan Abdomen on admission-stop Lisinopril, increase IVF to 150 cc/hr.  -  Monitor lytes  SIR's -secondary to acute pancreatitis -as above  Leukocytosis - Increased to 17k on 10/8.  - Likely secondary to pancreatic inflammation-low threshold to start empiric IV Antibiotics.   Hypertension - Mildly uncontrolled, likely secondary to pain.  - Discontinue lisinopril due to AKI/Hyperkalemia  Renal calculi - Seen on CT. Patient complaining of pain in right groin on 10/8. - Supportive care for now  Hyperkalemia - Elevated to 5.4 on 10/8. - Treated with Kayexalate-however with repeat labs showing worsening hyperkalemia-will treat with IV Insulin/Dextrose-recheck lytes later this pm    Thrombocytopenia, chronic - Seems stable. Follow CBCs.  DVT  Prophylaxis:  Lovenox  Code Status: Full Family Communication: No family at bedside.   Disposition Plan: Remain inpatient for now.  Consultants:  Gastroenterology  Procedures:  Abdominal Ultrasound   Objective: Filed Vitals:   02/01/2014 1757 01/08/2014 2027 01/13/14 0500 01/13/14 0900  BP: 159/83 155/71 108/72 125/57  Pulse: 92 105 109 100  Temp: 98.2 F (36.8 C) 98.4 F (36.9 C) 99 F (37.2 C) 99.1 F (37.3 C)  TempSrc: Oral Oral Oral Oral  Resp: 20 20 20 24   Height: 5\' 7"  (1.702 m)   5\' 7"  (1.702 m)  Weight: 84.2 kg (185 lb 10 oz)  84.2 kg (185 lb 10 oz) 84.2 kg (185 lb 10 oz)  SpO2: 94% 95% 93% 98%    Intake/Output Summary (Last 24 hours) at 01/13/14 1123 Last data filed at 01/13/14 0730  Gross per 24 hour  Intake   1375 ml  Output    350 ml  Net   1025 ml   Filed Weights   01/11/2014 1757 01/13/14 0500 01/13/14 0900  Weight: 84.2 kg (185 lb 10 oz) 84.2 kg (185 lb 10 oz) 84.2 kg (185 lb 10 oz)    Exam: General: Elderly male sitting upright in bed, complaining of pain and nausea.  Appears uncomfortable.     HEENT:  PERRL.  Icteic Sclera. Lips appear dry.  Neck is supple, no JVD, no masses.  Cardiovascular: Mildly tachycardic rate with regular rhythm, S1 S2 auscultated, no rubs, murmurs or gallops.   Respiratory: Diminished breath sounds bilaterally with equal chest rise.  [place] in place on 2 L of O2.  Abdomen: Firm and distended abdomen, diffuse tenderness, with + bowel sounds  Extremities:  warm dry without cyanosis clubbing or edema. 2+ distal pulses. Neuro: AAOx3, cranial nerves grossly intact. Strength 5/5 in upper and lower extremities  Psych: Normal affect and demeanor with intact judgement and insight  Data Reviewed: Basic Metabolic Panel:  Recent Labs Lab 01/18/2014 1010 01/13/14 0459  NA 143 139  K 4.6 5.4*  CL 106 106  CO2 24 17*  GLUCOSE 137* 114*  BUN 20 30*  CREATININE 1.22 1.65*  CALCIUM 8.9 8.3*   Liver Function Tests:  Recent Labs Lab  01/29/2014 1010 01/13/14 0459  AST 62* 128*  ALT 28 101*  ALKPHOS 102 94  BILITOT 1.6* 7.3*  PROT 7.0 6.7  ALBUMIN 3.6 3.4*    Recent Labs Lab 01/11/2014 1010 01/13/14 0459  LIPASE >3000* 2136*   CBC:  Recent Labs Lab 01/10/2014 1010 01/13/14 0459  WBC 14.1* 17.4*  NEUTROABS 12.5*  --   HGB 14.9 16.7  HCT 42.7 46.7  MCV 94.7 94.3  PLT 103* 114*    Studies: Ct Abdomen Pelvis W Contrast  02/03/2014   CLINICAL DATA:  Mid back and abdominal pain beginning yesterday. Chest pain.  EXAM: CT ABDOMEN AND PELVIS WITH CONTRAST  TECHNIQUE: Multidetector CT imaging of the abdomen and pelvis was performed using the standard protocol following bolus administration of intravenous contrast.  CONTRAST:  80 mL OMNIPAQUE IOHEXOL 300 MG/ML  SOLN  COMPARISON:  CT abdomen and pelvis 06/19/2010.  FINDINGS: Calcified pleural plaques are seen on the right. Dependent atelectasis is identified. Heart size is enlarged. Extensive calcific coronary artery disease is identified. There is no pleural or pericardial effusion.  There is a small volume of upper abdominal ascites. Stranding is seen about the pancreas. No organized fluid collection is present. The pancreas enhances homogeneously. The splenic and portal veins are patent.  The gallbladder, liver, spleen, biliary tree and left kidney are unremarkable. There is a staghorn is calculus in the right kidney. A linear calcification at the right UVJ consistent with a stone measures 0.7 cm in diameter. The prostate gland is unremarkable.  Aortoiliac atherosclerosis without aneurysm is present. There is no lymphadenopathy. Colonic diverticulosis without diverticulitis is identified. The small and appendix appear normal.  Bones demonstrate remote superior endplate compression fractures of L1 and L2. No lytic or sclerotic lesion is identified.  IMPRESSION: Findings consistent with pancreatitis without pancreatic necrosis or pseudocyst formation. Associated upper abdominal  ascites is identified.  Staghorn calculus right kidney. A 0.7 cm stone is seen at the right UVJ.  Extensive atherosclerotic vascular disease including calcific coronary atherosclerosis.  Calcified pleural plaques on the right may be due to some prior infectious or inflammatory process or possibly asbestos exposure.  Diverticulosis without diverticulitis.   Electronically Signed   By: Inge Rise M.D.   On: 02/03/2014 15:34   Dg Chest Portable 1 View  01/18/2014   CLINICAL DATA:  Chest discomfort and generalized weakness; history of aortic stenosis, anemia, hypertension, and tobacco use, initial visit.  EXAM: PORTABLE CHEST - 1 VIEW  COMPARISON:  None.  FINDINGS: The lungs are hypoinflated which limits the study. The interstitial markings are increased bilaterally. There is pleural calcification. There is blunting of the left lateral costophrenic angle. The cardiopericardial silhouette is enlarged. The central pulmonary vascularity is prominent. The bony thorax is unremarkable.  IMPRESSION: There is bilateral pulmonary hypo inflation with coarse interstitial lung markings. This may reflect low-grade CHF or early interstitial pneumonia. There is a small left pleural effusion. A followup PA and lateral chest x-ray with  deep inspiration would be useful when the patient can tolerate the procedure.   Electronically Signed   By: David  Martinique   On: 01/27/2014 10:09    Scheduled Meds: . aspirin  325 mg Oral Daily  . enoxaparin (LOVENOX) injection  40 mg Subcutaneous Q24H   Continuous Infusions: . sodium chloride 150 mL/hr at 01/13/14 1051    Principal Problem:   Acute pancreatitis Active Problems:   Acute kidney injury   Hypertension   Leukocytosis   Renal calculi   Thrombocytopenia   Hyperkalemia   Stage III chronic kidney disease   Rockwell Germany, PA-S2   [facility] Pager [phone]. If 7PM-7AM, please contact night-coverage at [email or web address], password [facility] 01/13/2014, 11:23 AM  LOS:  1 day    Attending Patient was seen, examined,treatment plan was discussed with the  Advance Practice Provider.  I have directly reviewed the clinical findings, lab, imaging studies and management of this patient in detail. I have made the necessary changes to the above noted documentation, and agree with the documentation, as recorded by the Advance Practice Provider.   81 year old gentleman admitted yesterday with pancreatitis. Has developed significant jaundice this morning, with worsening renal function and hyperkalemia. Suspect choledocholithiasis, although no gallstones seen on ultrasound, no CBD dilation was also seen as well. Will increase IV fluids to 150 cc an hour after giving 500 cc bolus, monitor patient closely, have consulted GI and have ordered an MRCP. Have discussed with patient's niece at bedside. Family aware that patient could continue to deteriorate.  Nena Alexander MD [facility].

## 2014-01-13 NOTE — Progress Notes (Signed)
Utilization review completed. Mataeo Ingwersen, RN, BSN. 

## 2014-01-13 NOTE — Progress Notes (Signed)
Report called to [place], Therapist, sports.

## 2014-01-13 NOTE — Progress Notes (Addendum)
ANTIBIOTIC CONSULT NOTE - INITIAL  Pharmacy Consult for doripenem Indication: intra-abdominal infection  No Known Allergies  Patient Measurements: Height: 5\' 7"  (170.2 cm) Weight: 185 lb 10 oz (84.2 kg) IBW/kg (Calculated) : 66.1 Adjusted Body Weight:   Vital Signs: Temp: 99.8 F (37.7 C) (10/08 1438) Temp Source: Oral (10/08 1438) BP: 128/64 mmHg (10/08 1438) Pulse Rate: 124 (10/08 1438) Intake/Output from previous day: 10/07 0701 - 10/08 0700 In: 1251.7 [I.V.:1251.7] Out: 200 [Urine:200] Intake/Output from this shift: Total I/O In: 145.8 [I.V.:145.8] Out: 150 [Urine:150]  Labs:  Recent Labs  01/27/2014 1010 01/13/14 0459 01/13/14 1055  WBC 14.1* 17.4*  --   HGB 14.9 16.7  --   PLT 103* 114*  --   CREATININE 1.22 1.65* 2.12*   Estimated Creatinine Clearance: 26.4 ml/min (by C-G formula based on Cr of 2.12). No results found for this basename: VANCOTROUGH, VANCOPEAK, VANCORANDOM, GENTTROUGH, GENTPEAK, GENTRANDOM, TOBRATROUGH, TOBRAPEAK, TOBRARND, AMIKACINPEAK, AMIKACINTROU, AMIKACIN,  in the last 72 hours   Microbiology: No results found for this or any previous visit (from the past 720 hour(s)).  Medical History: Past Medical History  Diagnosis Date  . Hypertension   . Aortic stenosis   . Psoriasis   . Chronic kidney disease (CKD), stage III (moderate)     /H&P 02/04/2014  . Acute pancreatitis 02/02/2014    /H&P 01/31/2014  . Arthritis     "'bout all my joints"  . Skin cancer of face     "burned them off"    Medications:  Prescriptions prior to admission  Medication Sig Dispense Refill  . Acetaminophen (ACETAMINOPHEN 8 HOUR) 650 MG TABS Take by mouth 2 (two) times daily as needed.      Marland Kitchen aspirin 325 MG tablet Take 325 mg by mouth daily.      . Cyanocobalamin (VITAMIN B-12) 1000 MCG SUBL Place under the tongue. lozenge      . docusate sodium (COLACE) 100 MG capsule Take 100 mg by mouth daily as needed for mild constipation.      Marland Kitchen lisinopril  (PRINIVIL,ZESTRIL) 20 MG tablet Take 20 mg by mouth daily.      . Omega-3 Fatty Acids (FISH OIL PO) Take 1 capsule by mouth 2 (two) times daily.       Assessment: 80 yo male with intra-abdominal infection/possible cholangitis who may require MRCP.  Pt with leukocytosis to 17, AKI with SCr to 2.12, and elevation of LFTs and Tbili from 1.6 > 8.4.    Goal of Therapy:  Resolution of infection  Plan:  Doripenem 250 mg IV q8h Monitor renal function, clinical progress   Hughes Better, PharmD, BCPS Clinical Pharmacist Pager: [phone] 01/13/2014 4:07 PM   Addn: With sCr and urine output worsening, will decrease frequency of doripenem to 250mg  IV q12h. Will continue to monitor renal function and adjust dose as necessary.  Andrey Cota. Diona Foley, PharmD Clinical Pharmacist Pager [phone]

## 2014-01-14 LAB — GLUCOSE, CAPILLARY: GLUCOSE-CAPILLARY: 71 mg/dL (ref 70–99)

## 2014-01-20 NOTE — Discharge Summary (Signed)
Death Summary  Jesse Morton [id number] DOB: 1926-04-16 DOA: 01/10/2014  PCP: Donnie Coffin, MD PCP/Office notified: death summary forwarded  Admit date: 01/24/2014 Date of Death: Jan 16, 2014  Final Diagnoses:  Principal Problem:   Severe acute pancreatitis Active Problems:   Sepsis   ARF   Ileus   Hyperkalemia     Thrombocytopenia   Hypertension   Leukocytosis   Stage III chronic kidney disease   Renal calculi    History of present illness:  Jesse Morton is a 80 y.o. male with history of hypertension, tobacco abuse, alcohol use, presented to ED with severe abdominal pain. Pain was in lower mid sternal/epigastric abdominal pain, 10/10 in intensity.Sublingual NTG provided by EMS did not help his pain.He presented to the ED where lipase >2000 and CT abdomen confirms acute pancreatitis without complicating features. Patient was admitted for further evaluation and treatment  Hospital Course:  Presumed Severe Acute Pancreatitis  - Patient was admitted with presumed biliary pancreatitis. Abdominal ultrasound did not show any gallstones CBD dilatation, however post admission started having significantly elevated bilirubin levels. Unfortunately, he rapidly declined with development of worsening renal failure, acidosis and leukocytosis. Gastroenterology was consulted, supportive care was continued, patient was then moved to a step down unit. Unfortunately he continued to deteriorate, he developed respiratory failure, worsening acidosis and ileus. Family discussion was held by night NP, critical state/rapid decline was explained, need for critical care with intubation, was discussed. Patient's niece, stated that the patient did not want to be kept alive by machines or intubated. Subsequently, comfort care was initiated. Patient was started on IV morphine, and subsequently expired on January 16, 2014 at 0500 hours  Hyperkalemia - Secondary to worsening renal function. This was treated with IV insulin,  dextrose and Kayexalate.  Acute renal failure with severe anion gap acidosis - Secondary to severe pancreatitis. Treated with supportive care.  Acute hypoxic/hypercarbic respiratory failure - Secondary to presumed acute lung injury from severe acute pancreatitis - As noted above, full care including mechanical ventilation was offered the family-however the family elected transition to comfort measures.  Sepsis -likely secondary to acute pancretitis/Ileus.  - As noted above, full care including mechanical ventilation was offered the family-however the family elected transition to comfort measures.  Ileus - Secondary to pancreatitis. Managed with supportive care.  SignedOren Binet  [facility] 01/20/2014, 8:16 PM

## 2014-02-06 DEATH — deceased

## 2016-03-29 IMAGING — CR DG ABD PORTABLE 2V
2 series · 2 of 2 positions shown · non-contrast
Comparison: CT of 1 day prior

CLINICAL DATA: Abdominal pain.  Pancreatitis.

EXAM:
PORTABLE ABDOMEN - 2 VIEW

[supine ap]
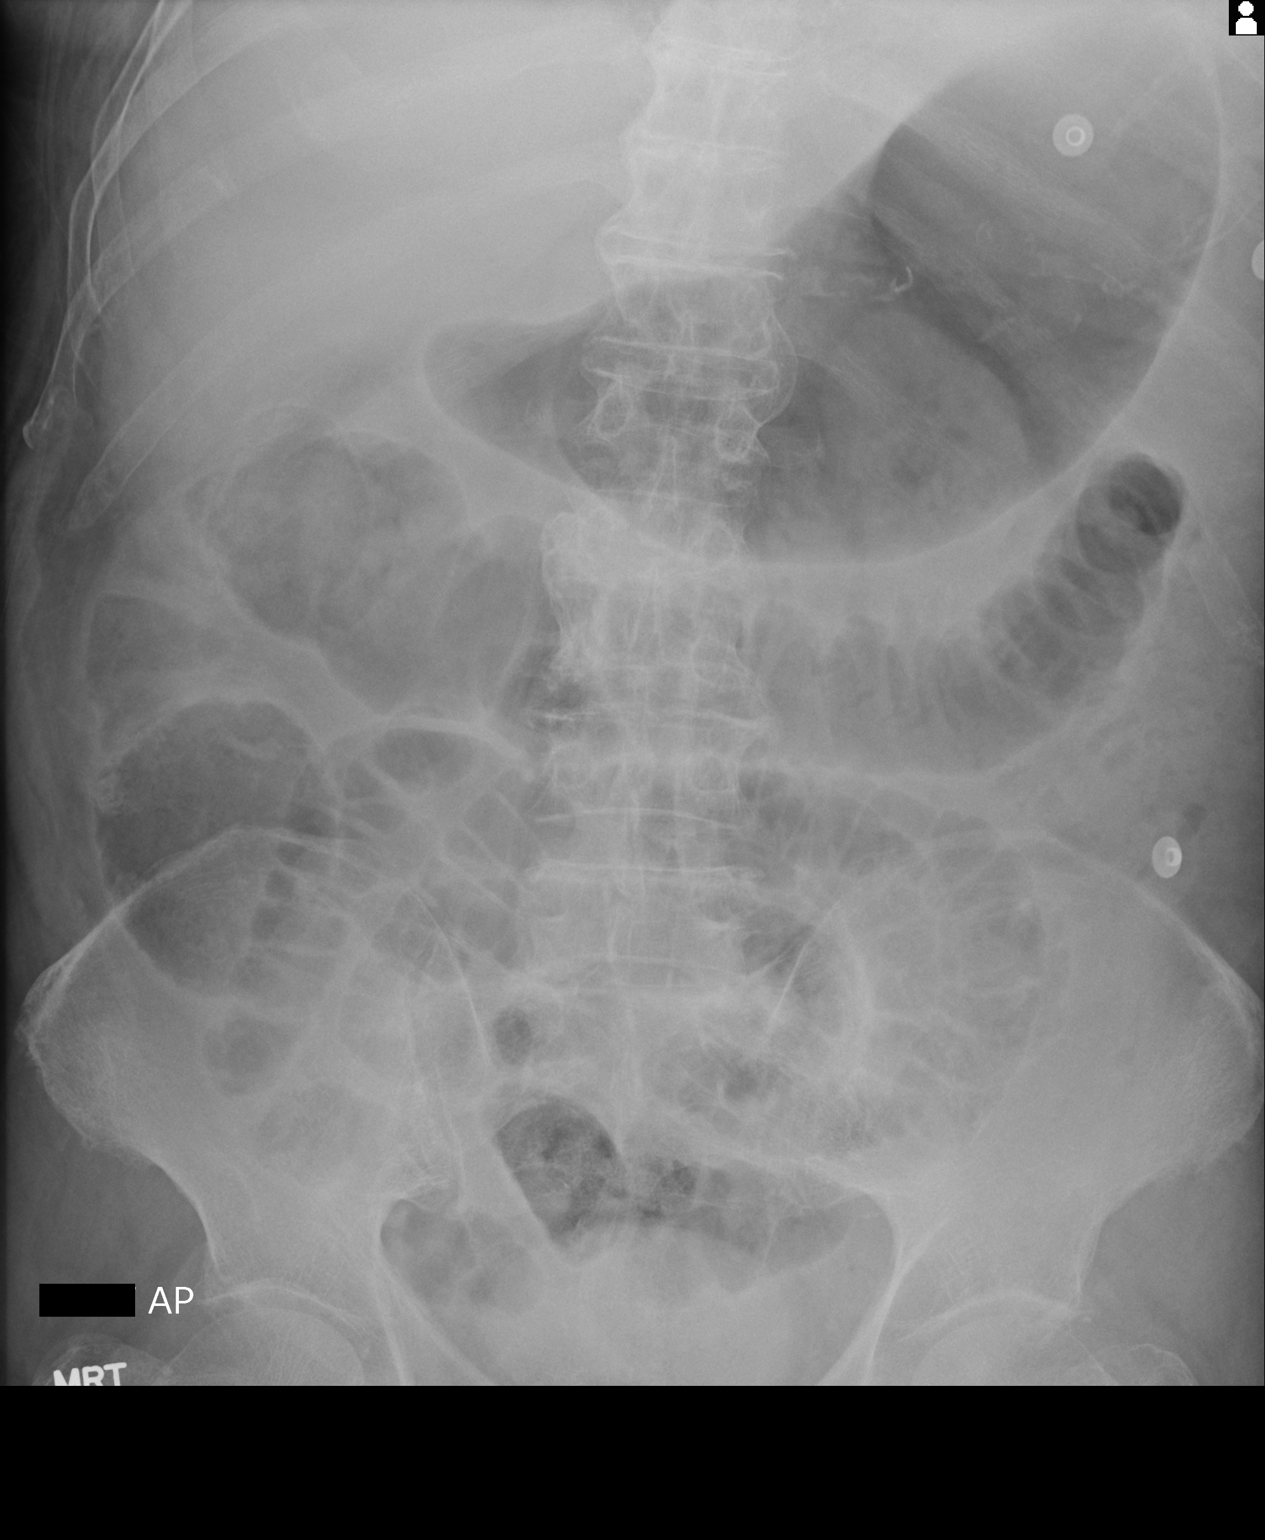

[l-decub]
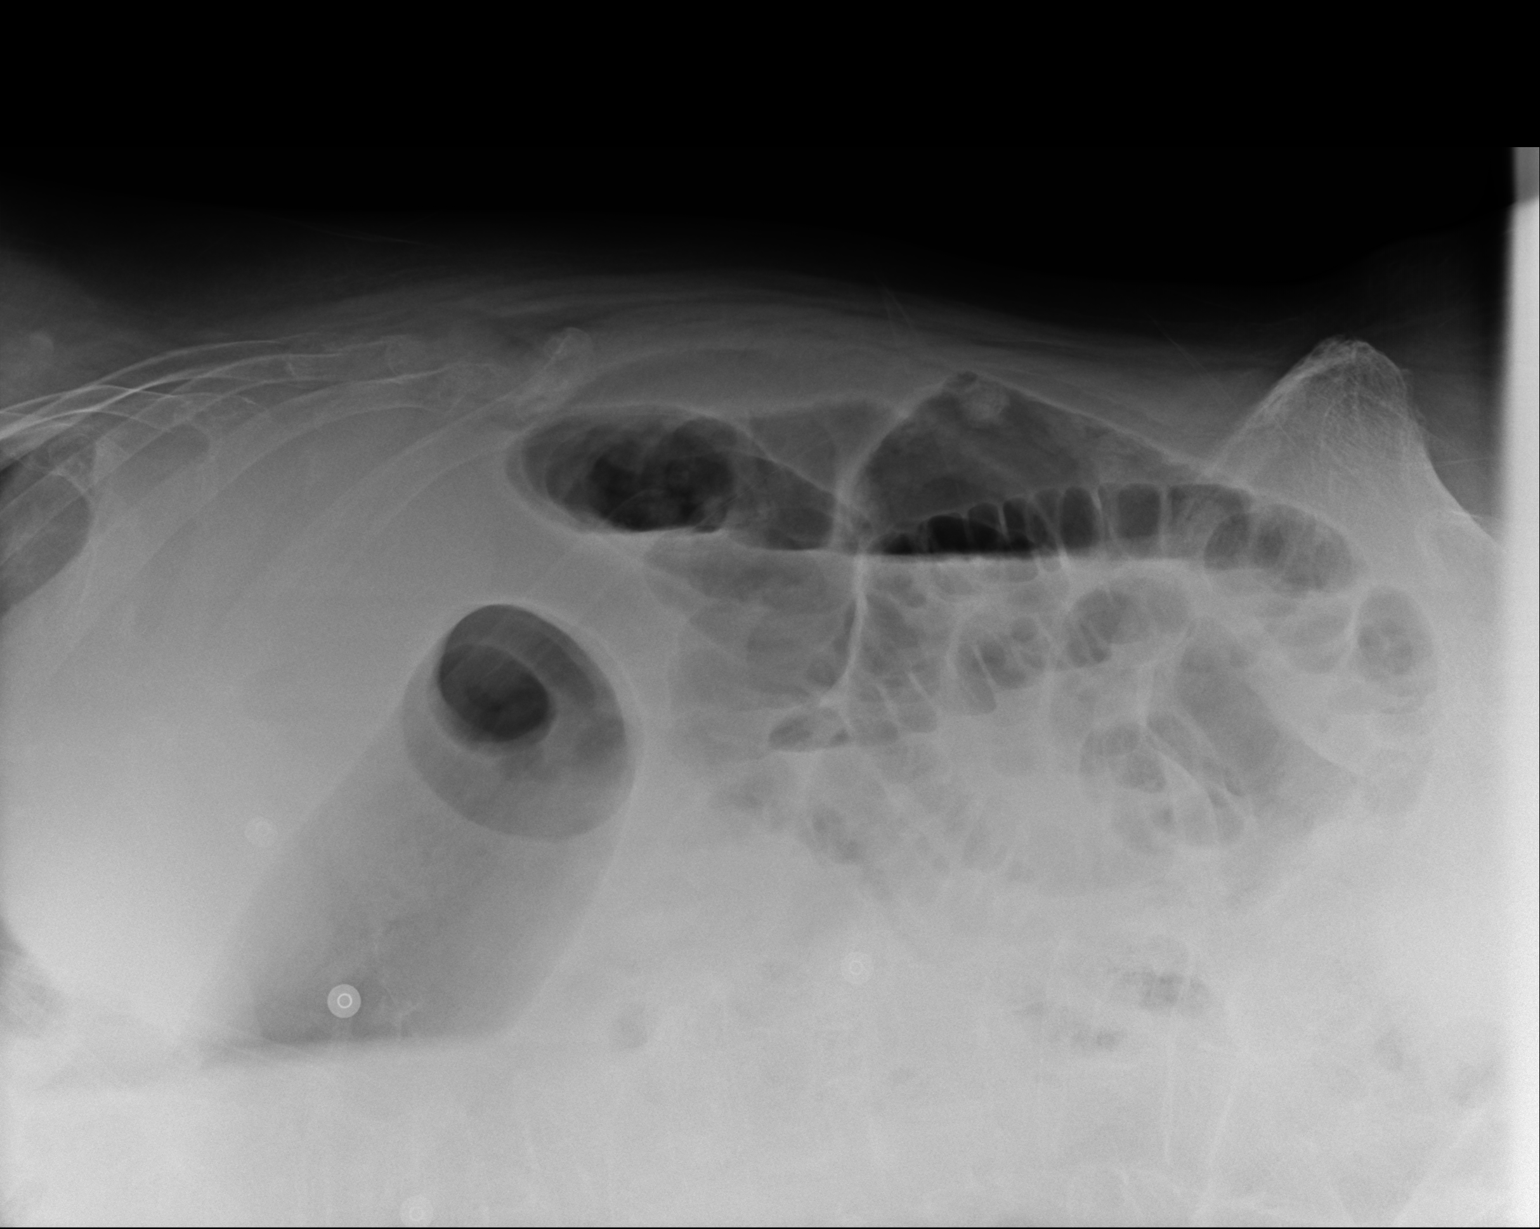

[2 of 2 positions shown; findings below may reference images not displayed]

FINDINGS: Supine view of the abdomen and pelvis. Mild gastric distension. Gas
within normal caliber colon. Small bowel loops measure up to 2.7 cm,
gas-filled. Rectosigmoid gas identified. No free intraperitoneal
air. No abnormal abdominal calcifications. No appendicolith.
IMPRESSION: Mild gaseous distention of the stomach with upper normal size
gas-filled small bowel loops. Question mild adynamic ileus secondary
to pancreatitis. Low grade partial obstruction could look similar.
